# Patient Record
Sex: Female | Born: 1980 | Race: White | Hispanic: No | Marital: Married | Smoking: Former smoker
Health system: Southern US, Community
[De-identification: ages and names within clinical notes are randomized; demographics above are authoritative.]

## PROBLEM LIST (undated history)

## (undated) DIAGNOSIS — F419 Anxiety disorder, unspecified: Secondary | ICD-10-CM

## (undated) DIAGNOSIS — E039 Hypothyroidism, unspecified: Secondary | ICD-10-CM

## (undated) DIAGNOSIS — K219 Gastro-esophageal reflux disease without esophagitis: Secondary | ICD-10-CM

## (undated) DIAGNOSIS — E785 Hyperlipidemia, unspecified: Secondary | ICD-10-CM

## (undated) DIAGNOSIS — K589 Irritable bowel syndrome without diarrhea: Secondary | ICD-10-CM

## (undated) DIAGNOSIS — F32A Depression, unspecified: Secondary | ICD-10-CM

## (undated) HISTORY — PX: MOUTH SURGERY: SHX715

---

## 2004-03-28 ENCOUNTER — Emergency Department (HOSPITAL_COMMUNITY): Admission: EM | Admit: 2004-03-28 | Discharge: 2004-03-28 | Payer: Self-pay | Admitting: Emergency Medicine

## 2006-04-09 ENCOUNTER — Other Ambulatory Visit: Admission: RE | Admit: 2006-04-09 | Discharge: 2006-04-09 | Payer: Self-pay | Admitting: Obstetrics and Gynecology

## 2007-04-22 ENCOUNTER — Other Ambulatory Visit: Admission: RE | Admit: 2007-04-22 | Discharge: 2007-04-22 | Payer: Self-pay | Admitting: Obstetrics and Gynecology

## 2007-09-02 ENCOUNTER — Encounter: Admission: RE | Admit: 2007-09-02 | Discharge: 2007-09-02 | Payer: Self-pay | Admitting: Family Medicine

## 2008-01-27 ENCOUNTER — Ambulatory Visit (HOSPITAL_COMMUNITY): Admission: RE | Admit: 2008-01-27 | Discharge: 2008-01-27 | Payer: Self-pay | Admitting: Obstetrics and Gynecology

## 2008-07-05 ENCOUNTER — Encounter: Admission: RE | Admit: 2008-07-05 | Discharge: 2008-07-05 | Payer: Self-pay | Admitting: Obstetrics and Gynecology

## 2008-08-08 ENCOUNTER — Inpatient Hospital Stay (HOSPITAL_COMMUNITY): Admission: AD | Admit: 2008-08-08 | Discharge: 2008-08-11 | Payer: Self-pay | Admitting: Obstetrics and Gynecology

## 2008-08-13 ENCOUNTER — Encounter: Admission: RE | Admit: 2008-08-13 | Discharge: 2008-09-11 | Payer: Self-pay | Admitting: Obstetrics and Gynecology

## 2008-08-17 ENCOUNTER — Ambulatory Visit: Admission: RE | Admit: 2008-08-17 | Discharge: 2008-08-17 | Payer: Self-pay | Admitting: Obstetrics and Gynecology

## 2008-09-12 ENCOUNTER — Encounter: Admission: RE | Admit: 2008-09-12 | Discharge: 2008-09-15 | Payer: Self-pay | Admitting: Obstetrics and Gynecology

## 2008-11-16 ENCOUNTER — Other Ambulatory Visit: Admission: RE | Admit: 2008-11-16 | Discharge: 2008-11-16 | Payer: Self-pay | Admitting: Obstetrics and Gynecology

## 2009-12-25 ENCOUNTER — Other Ambulatory Visit: Admission: RE | Admit: 2009-12-25 | Discharge: 2009-12-25 | Payer: Self-pay | Admitting: Obstetrics and Gynecology

## 2010-12-31 ENCOUNTER — Other Ambulatory Visit (HOSPITAL_COMMUNITY)
Admission: RE | Admit: 2010-12-31 | Discharge: 2010-12-31 | Disposition: A | Discharge status: Home / Self Care | Payer: BC Managed Care – PPO | Source: Ambulatory Visit | Attending: Obstetrics and Gynecology | Admitting: Obstetrics and Gynecology

## 2010-12-31 ENCOUNTER — Other Ambulatory Visit: Payer: Self-pay | Admitting: Obstetrics and Gynecology

## 2010-12-31 DIAGNOSIS — Z01419 Encounter for gynecological examination (general) (routine) without abnormal findings: Secondary | ICD-10-CM | POA: Insufficient documentation

## 2011-07-15 LAB — CBC
HCT: 27.3 — ABNORMAL LOW
HCT: 38.3
Hemoglobin: 12.9
Hemoglobin: 9.4 — ABNORMAL LOW
MCHC: 33.8
MCHC: 34.3
Platelets: 146 — ABNORMAL LOW
RBC: 2.9 — ABNORMAL LOW
RBC: 4.16
RDW: 12.8
WBC: 13.5 — ABNORMAL HIGH

## 2012-01-15 ENCOUNTER — Other Ambulatory Visit: Payer: Self-pay | Admitting: Nurse Practitioner

## 2012-01-15 ENCOUNTER — Other Ambulatory Visit (HOSPITAL_COMMUNITY)
Admission: RE | Admit: 2012-01-15 | Discharge: 2012-01-15 | Disposition: A | Discharge status: Home / Self Care | Payer: BC Managed Care – PPO | Source: Ambulatory Visit | Attending: Obstetrics and Gynecology | Admitting: Obstetrics and Gynecology

## 2012-01-15 DIAGNOSIS — Z01419 Encounter for gynecological examination (general) (routine) without abnormal findings: Secondary | ICD-10-CM | POA: Insufficient documentation

## 2012-01-15 DIAGNOSIS — Z1159 Encounter for screening for other viral diseases: Secondary | ICD-10-CM | POA: Insufficient documentation

## 2013-02-23 ENCOUNTER — Other Ambulatory Visit: Payer: Self-pay | Admitting: Obstetrics and Gynecology

## 2013-02-23 ENCOUNTER — Other Ambulatory Visit (HOSPITAL_COMMUNITY)
Admission: RE | Admit: 2013-02-23 | Discharge: 2013-02-23 | Disposition: A | Discharge status: Home / Self Care | Payer: BC Managed Care – PPO | Source: Ambulatory Visit | Attending: Obstetrics and Gynecology | Admitting: Obstetrics and Gynecology

## 2013-02-23 DIAGNOSIS — Z01419 Encounter for gynecological examination (general) (routine) without abnormal findings: Secondary | ICD-10-CM | POA: Insufficient documentation

## 2014-02-27 ENCOUNTER — Other Ambulatory Visit (HOSPITAL_COMMUNITY)
Admission: RE | Admit: 2014-02-27 | Discharge: 2014-02-27 | Disposition: A | Discharge status: Home / Self Care | Payer: BC Managed Care – PPO | Source: Ambulatory Visit | Attending: Obstetrics and Gynecology | Admitting: Obstetrics and Gynecology

## 2014-02-27 ENCOUNTER — Other Ambulatory Visit: Payer: Self-pay | Admitting: Obstetrics and Gynecology

## 2014-02-27 DIAGNOSIS — Z01419 Encounter for gynecological examination (general) (routine) without abnormal findings: Secondary | ICD-10-CM | POA: Insufficient documentation

## 2014-04-24 ENCOUNTER — Other Ambulatory Visit: Payer: Self-pay | Admitting: Family Medicine

## 2014-04-24 ENCOUNTER — Encounter (INDEPENDENT_AMBULATORY_CARE_PROVIDER_SITE_OTHER): Payer: Self-pay

## 2014-04-24 ENCOUNTER — Ambulatory Visit
Admission: RE | Admit: 2014-04-24 | Discharge: 2014-04-24 | Disposition: A | Discharge status: Home / Self Care | Payer: BC Managed Care – PPO | Source: Ambulatory Visit | Attending: Family Medicine | Admitting: Family Medicine

## 2014-04-24 DIAGNOSIS — M79604 Pain in right leg: Secondary | ICD-10-CM

## 2015-03-19 ENCOUNTER — Other Ambulatory Visit: Payer: Self-pay | Admitting: Obstetrics and Gynecology

## 2015-03-19 ENCOUNTER — Other Ambulatory Visit (HOSPITAL_COMMUNITY)
Admission: RE | Admit: 2015-03-19 | Discharge: 2015-03-19 | Disposition: A | Discharge status: Home / Self Care | Payer: BC Managed Care – PPO | Source: Ambulatory Visit | Attending: Obstetrics and Gynecology | Admitting: Obstetrics and Gynecology

## 2015-03-19 DIAGNOSIS — Z01419 Encounter for gynecological examination (general) (routine) without abnormal findings: Secondary | ICD-10-CM | POA: Diagnosis not present

## 2015-03-19 DIAGNOSIS — Z1151 Encounter for screening for human papillomavirus (HPV): Secondary | ICD-10-CM | POA: Insufficient documentation

## 2015-03-20 LAB — CYTOLOGY - PAP

## 2016-02-15 ENCOUNTER — Encounter (HOSPITAL_COMMUNITY): Payer: Self-pay | Admitting: Emergency Medicine

## 2016-02-15 ENCOUNTER — Emergency Department (HOSPITAL_COMMUNITY)
Admission: EM | Admit: 2016-02-15 | Discharge: 2016-02-16 | Disposition: A | Discharge status: Home / Self Care | Payer: BC Managed Care – PPO | Attending: Emergency Medicine | Admitting: Emergency Medicine

## 2016-02-15 DIAGNOSIS — L500 Allergic urticaria: Secondary | ICD-10-CM | POA: Diagnosis not present

## 2016-02-15 DIAGNOSIS — Y998 Other external cause status: Secondary | ICD-10-CM | POA: Diagnosis not present

## 2016-02-15 DIAGNOSIS — R0602 Shortness of breath: Secondary | ICD-10-CM | POA: Insufficient documentation

## 2016-02-15 DIAGNOSIS — R220 Localized swelling, mass and lump, head: Secondary | ICD-10-CM | POA: Diagnosis not present

## 2016-02-15 DIAGNOSIS — Z87891 Personal history of nicotine dependence: Secondary | ICD-10-CM | POA: Insufficient documentation

## 2016-02-15 DIAGNOSIS — T7840XA Allergy, unspecified, initial encounter: Secondary | ICD-10-CM | POA: Diagnosis present

## 2016-02-15 DIAGNOSIS — R000 Tachycardia, unspecified: Secondary | ICD-10-CM | POA: Diagnosis not present

## 2016-02-15 DIAGNOSIS — Y9289 Other specified places as the place of occurrence of the external cause: Secondary | ICD-10-CM | POA: Diagnosis not present

## 2016-02-15 DIAGNOSIS — X58XXXA Exposure to other specified factors, initial encounter: Secondary | ICD-10-CM | POA: Diagnosis not present

## 2016-02-15 DIAGNOSIS — Y9389 Activity, other specified: Secondary | ICD-10-CM | POA: Insufficient documentation

## 2016-02-15 MED ORDER — FAMOTIDINE IN NACL 20-0.9 MG/50ML-% IV SOLN
20.0000 mg | Freq: Once | INTRAVENOUS | Status: AC
  Administered 2016-02-15: 20 mg via INTRAVENOUS
  Filled 2016-02-15: qty 50

## 2016-02-15 MED ORDER — PREDNISONE 50 MG PO TABS: 50.0000 mg | ORAL_TABLET | Freq: Every day | ORAL | Status: DC

## 2016-02-15 MED ORDER — EPINEPHRINE 0.3 MG/0.3ML IJ SOAJ: 0.3000 mg | Freq: Once | INTRAMUSCULAR | Status: AC

## 2016-02-15 NOTE — ED Provider Notes (Signed)
CSN: [id number]     Arrival date & time 02/15/16  2218 History   First MD Initiated Contact with Patient 02/15/16 2248     Chief Complaint  Patient presents with  . Allergic Reaction     (Consider location/radiation/quality/duration/timing/severity/associated sxs/prior Treatment) HPI Comments: Patient here complaining of allergic reaction from an unknown allergen just prior to arrival. She has multiple known allergies but states nothing has changed recently. She developed whole body erythema with some pruritus. She became short of breath and had swelling of her lip as well as her ear with some emesis. Shows mixed prior to her wheezing. EMS was called and patient given Benadryl 50 mg as well as albuterol 5 mg, 0.3 mg of epinephrine, Solu-Medrol 125 mg followed by 4 Zofran. She felt this better at this time. Her skin erythema has greatly improved.  Patient is a 35 y.o. female presenting with allergic reaction. The history is provided by the patient.  Allergic Reaction   History reviewed. No pertinent past medical history. History reviewed. No pertinent past surgical history. History reviewed. No pertinent family history. Social History  Substance Use Topics  . Smoking status: Former Smoker    Quit date: 02/15/1999  . Smokeless tobacco: Never Used  . Alcohol Use: Yes     Comment: occassionally   OB History    No data available     Review of Systems  All other systems reviewed and are negative.     Allergies  Ginger; Sesame oil; and Shellfish allergy  Home Medications   Prior to Admission medications   Not on File   BP 112/73 mmHg  Pulse 102  Temp(Src) 97.5 F (36.4 C) (Oral)  Resp 18  SpO2 97%  LMP  (Within Weeks) Physical Exam  Constitutional: She is oriented to person, place, and time. She appears well-developed and well-nourished.  Non-toxic appearance. No distress.  HENT:  Head: Normocephalic and atraumatic.  Eyes: Conjunctivae, EOM and lids are normal. Pupils  are equal, round, and reactive to light.  Neck: Normal range of motion. Neck supple. No tracheal deviation present. No thyroid mass present.  Cardiovascular: Regular rhythm and normal heart sounds.  Tachycardia present.  Exam reveals no gallop.   No murmur heard. Pulmonary/Chest: Effort normal and breath sounds normal. No stridor. No respiratory distress. She has no decreased breath sounds. She has no wheezes. She has no rhonchi. She has no rales.  Abdominal: Soft. Normal appearance and bowel sounds are normal. She exhibits no distension. There is no tenderness. There is no rebound and no CVA tenderness.  Musculoskeletal: Normal range of motion. She exhibits no edema or tenderness.  Neurological: She is alert and oriented to person, place, and time. She has normal strength. No cranial nerve deficit or sensory deficit. GCS eye subscore is 4. GCS verbal subscore is 5. GCS motor subscore is 6.  Skin: Skin is warm and dry. Rash noted. No abrasion noted. Rash is urticarial.  Psychiatric: She has a normal mood and affect. Her speech is normal and behavior is normal.  Nursing note and vitals reviewed.   ED Course  Procedures (including critical care time) Labs Review Labs Reviewed - No data to display  Imaging Review No results found. I have personally reviewed and evaluated these images and lab results as part of my medical decision-making.   EKG Interpretation None      MDM   Final diagnoses:  None    She will be given Pepcid IV piggyback care. Should be monitored  here for 2 hours. Will be given prescription for epi pin and she will follow-up with her allergist    Lorre NickAnthony Dejanique Ruehl, MD 02/15/16 2258

## 2016-02-15 NOTE — ED Notes (Signed)
Bed: WA04 Expected date:  Expected time:  Means of arrival:  Comments: Allergic reaction

## 2016-02-15 NOTE — ED Notes (Signed)
[facility] presents with a 35 yo female from home with an apparent allergic reaction.  Pt has allergies to ginger, shellfish and sesame but unknown to other allergens but patient got same reaction to something just before 9 pm this evening.  Pt became flushed, SOB, tingling in extremities, bilateral ear and lip swelling with vomiting.  Pt also had expiratory wheeze.  [facility] gave 50 mg IV Benadryl, 5 mg Albuterol, 0.3 epi IM, 125 mg Solumedrol IV, 4 mg IV Zofran.  Pt sinus tach on monitor/12 lead

## 2016-02-15 NOTE — Discharge Instructions (Signed)

## 2016-02-16 NOTE — ED Provider Notes (Signed)
By signing my name below, I, Emmanuella Mensah, attest that this documentation has been prepared under the direction and in the presence of Linwood DibblesJon Asianae Minkler, MD. Electronically Signed: Angelene GiovanniEmmanuella Mensah, ED Scribe. 02/16/2016. 12:04 AM.   12:04 AM Pt states that she feels well, no complaints. Allergic symptoms resolved. Will continue to monitor.   12:57 AM No recurrent symptoms. Pt feels fine. Will discharge with outpatient follow up.   I personally performed the services described in this documentation, which was scribed in my presence.  The recorded information has been reviewed and is accurate.   Linwood DibblesJon Tahlia Deamer, MD 02/16/16 0100

## 2016-05-08 ENCOUNTER — Other Ambulatory Visit: Payer: Self-pay | Admitting: Obstetrics and Gynecology

## 2016-05-08 ENCOUNTER — Other Ambulatory Visit (HOSPITAL_COMMUNITY)
Admission: RE | Admit: 2016-05-08 | Discharge: 2016-05-08 | Disposition: A | Discharge status: Home / Self Care | Payer: BC Managed Care – PPO | Source: Ambulatory Visit | Attending: Obstetrics and Gynecology | Admitting: Obstetrics and Gynecology

## 2016-05-08 DIAGNOSIS — Z01419 Encounter for gynecological examination (general) (routine) without abnormal findings: Secondary | ICD-10-CM | POA: Insufficient documentation

## 2016-05-09 LAB — CYTOLOGY - PAP

## 2018-05-12 ENCOUNTER — Other Ambulatory Visit (HOSPITAL_COMMUNITY)
Admission: RE | Admit: 2018-05-12 | Discharge: 2018-05-12 | Disposition: A | Discharge status: Home / Self Care | Payer: BC Managed Care – PPO | Source: Ambulatory Visit | Attending: Obstetrics and Gynecology | Admitting: Obstetrics and Gynecology

## 2018-05-12 ENCOUNTER — Other Ambulatory Visit: Payer: Self-pay | Admitting: Obstetrics and Gynecology

## 2018-05-12 DIAGNOSIS — Z01411 Encounter for gynecological examination (general) (routine) with abnormal findings: Secondary | ICD-10-CM | POA: Diagnosis present

## 2018-05-13 LAB — CYTOLOGY - PAP
DIAGNOSIS: NEGATIVE
HPV: NOT DETECTED

## 2019-09-05 ENCOUNTER — Other Ambulatory Visit: Payer: Self-pay

## 2019-09-05 DIAGNOSIS — Z20822 Contact with and (suspected) exposure to covid-19: Secondary | ICD-10-CM

## 2019-09-07 LAB — NOVEL CORONAVIRUS, NAA: SARS-CoV-2, NAA: NOT DETECTED

## 2020-06-19 ENCOUNTER — Other Ambulatory Visit: Payer: Self-pay | Admitting: Family Medicine

## 2020-06-19 DIAGNOSIS — Z1231 Encounter for screening mammogram for malignant neoplasm of breast: Secondary | ICD-10-CM

## 2020-06-22 ENCOUNTER — Other Ambulatory Visit: Payer: Self-pay

## 2020-06-22 ENCOUNTER — Ambulatory Visit
Admission: RE | Admit: 2020-06-22 | Discharge: 2020-06-22 | Disposition: A | Discharge status: Home / Self Care | Payer: BC Managed Care – PPO | Source: Ambulatory Visit | Attending: Family Medicine | Admitting: Family Medicine

## 2020-06-22 DIAGNOSIS — Z1231 Encounter for screening mammogram for malignant neoplasm of breast: Secondary | ICD-10-CM

## 2021-03-24 ENCOUNTER — Other Ambulatory Visit: Payer: Self-pay

## 2021-03-24 ENCOUNTER — Emergency Department (HOSPITAL_COMMUNITY): Payer: BC Managed Care – PPO

## 2021-03-24 ENCOUNTER — Emergency Department (HOSPITAL_COMMUNITY)
Admission: EM | Admit: 2021-03-24 | Discharge: 2021-03-25 | Disposition: A | Discharge status: Home / Self Care | Payer: BC Managed Care – PPO | Attending: Emergency Medicine | Admitting: Emergency Medicine

## 2021-03-24 DIAGNOSIS — W109XXA Fall (on) (from) unspecified stairs and steps, initial encounter: Secondary | ICD-10-CM | POA: Diagnosis not present

## 2021-03-24 DIAGNOSIS — Z87891 Personal history of nicotine dependence: Secondary | ICD-10-CM | POA: Diagnosis not present

## 2021-03-24 DIAGNOSIS — R000 Tachycardia, unspecified: Secondary | ICD-10-CM | POA: Insufficient documentation

## 2021-03-24 DIAGNOSIS — R52 Pain, unspecified: Secondary | ICD-10-CM

## 2021-03-24 DIAGNOSIS — S82851A Displaced trimalleolar fracture of right lower leg, initial encounter for closed fracture: Secondary | ICD-10-CM | POA: Diagnosis not present

## 2021-03-24 DIAGNOSIS — Y92009 Unspecified place in unspecified non-institutional (private) residence as the place of occurrence of the external cause: Secondary | ICD-10-CM | POA: Insufficient documentation

## 2021-03-24 DIAGNOSIS — S99911A Unspecified injury of right ankle, initial encounter: Secondary | ICD-10-CM | POA: Diagnosis present

## 2021-03-24 MED ORDER — ETOMIDATE 2 MG/ML IV SOLN
INTRAVENOUS | Status: AC | PRN
  Administered 2021-03-24: 10 mg via INTRAVENOUS

## 2021-03-24 MED ORDER — HYDROMORPHONE HCL 1 MG/ML IJ SOLN
1.0000 mg | Freq: Once | INTRAMUSCULAR | Status: AC
  Administered 2021-03-24: 1 mg via INTRAVENOUS
  Filled 2021-03-24: qty 1

## 2021-03-24 MED ORDER — OXYCODONE-ACETAMINOPHEN 5-325 MG PO TABS: 1.0000 | ORAL_TABLET | Freq: Four times a day (QID) | ORAL | 0 refills | Status: DC | PRN

## 2021-03-24 MED ORDER — HYDROMORPHONE HCL 1 MG/ML IJ SOLN
1.0000 mg | Freq: Once | INTRAMUSCULAR | Status: AC
Start: 2021-03-24 — End: 2021-03-24
  Administered 2021-03-24: 1 mg via INTRAVENOUS
  Filled 2021-03-24: qty 1

## 2021-03-24 MED ORDER — LORAZEPAM 2 MG/ML IJ SOLN
0.5000 mg | Freq: Once | INTRAMUSCULAR | Status: AC
  Administered 2021-03-24: 0.5 mg via INTRAVENOUS
  Filled 2021-03-24: qty 1

## 2021-03-24 MED ORDER — IBUPROFEN 600 MG PO TABS: 600.0000 mg | ORAL_TABLET | Freq: Four times a day (QID) | ORAL | 0 refills | Status: AC | PRN

## 2021-03-24 MED ORDER — ETOMIDATE 2 MG/ML IV SOLN
10.0000 mg | Freq: Once | INTRAVENOUS | Status: AC
  Administered 2021-03-24: 10 mg via INTRAVENOUS
  Filled 2021-03-24: qty 10

## 2021-03-24 NOTE — ED Triage Notes (Signed)
PT BIB EMS due to falling down the stairs. Pt has complete deformity to right ankle. Pt denies hitting head. Pt received 10mg  of morphine.

## 2021-03-24 NOTE — Discharge Instructions (Addendum)
Non weight bearing to right ankle.  Keep ankle elevated.

## 2021-03-24 NOTE — ED Provider Notes (Signed)
[facility] EMERGENCY DEPARTMENT Provider Note   CSN: [id number] Arrival date & time: 03/24/21  2118     History Chief Complaint  Patient presents with   Ankle Pain    April Schultz is a 40 y.o. female.  Pt presents to the ED today with right ankle pain.  Pt fell down the stairs at home.  She denies hitting her head or loc.  EMS gave pt 10 mg of morphine en route for pain control, but it still hurts a lot.  No other injuries.      No past medical history on file.  There are no problems to display for this patient.   No past surgical history on file.   OB History   No obstetric history on file.     Family History  Problem Relation Age of Onset   Breast cancer Mother 45    Social History   Tobacco Use   Smoking status: Former    Pack years: 0.00    Types: Cigarettes    Quit date: 02/15/1999    Years since quitting: 22.1   Smokeless tobacco: Never  Substance Use Topics   Alcohol use: Yes    Comment: occassionally   Drug use: No    Home Medications Prior to Admission medications   Medication Sig Start Date End Date Taking? Authorizing Provider  ibuprofen (ADVIL) 600 MG tablet Take 1 tablet (600 mg total) by mouth every 6 (six) hours as needed. 03/24/21  Yes Jacalyn Lefevre, MD  oxyCODONE-acetaminophen (PERCOCET/ROXICET) 5-325 MG tablet Take 1 tablet by mouth every 6 (six) hours as needed for severe pain. 03/24/21  Yes Jacalyn Lefevre, MD  ALPRAZolam Prudy Feeler) 0.25 MG tablet Take 0.25-0.5 mg by mouth 3 (three) times daily as needed. For anxiety. 11/23/15   [provider]  buPROPion (WELLBUTRIN XL) 150 MG 24 hr tablet Take 150 mg by mouth every morning. 12/18/15   [provider]  cetirizine (ZYRTEC) 10 MG tablet Take 10 mg by mouth daily.    [provider]  clonazePAM (KLONOPIN) 0.5 MG tablet Take 0.5-1 mg by mouth at bedtime as needed. For insomnia. 11/23/15   [provider]  EPINEPHrine 0.3 mg/0.3 mL IJ  SOAJ injection Inject 0.3 mLs (0.3 mg total) into the muscle once. 02/15/16   Lorre Nick, MD  norethindrone-ethinyl estradiol (MICROGESTIN,JUNEL,LOESTRIN) 1-20 MG-MCG tablet Take 1 tablet by mouth daily. 11/25/15   [provider]  predniSONE (DELTASONE) 50 MG tablet Take 1 tablet (50 mg total) by mouth daily. 02/15/16   Lorre Nick, MD  sertraline (ZOLOFT) 100 MG tablet Take 50 mg by mouth daily. 12/08/15   [provider]    Allergies    Ginger, Sesame oil, and Shellfish allergy  Review of Systems   Review of Systems  Musculoskeletal:        Right ankle pain  All other systems reviewed and are negative.  Physical Exam Updated Vital Signs BP 116/78   Pulse 98   Temp 98.4 F (36.9 C) (Oral)   Resp 12   SpO2 96%   Physical Exam Vitals and nursing note reviewed.  Constitutional:      Appearance: Normal appearance.  HENT:     Head: Normocephalic and atraumatic.     Right Ear: External ear normal.     Left Ear: External ear normal.     Nose: Nose normal.     Mouth/Throat:     Mouth: Mucous membranes are moist.  Pharynx: Oropharynx is clear.  Eyes:     Extraocular Movements: Extraocular movements intact.     Conjunctiva/sclera: Conjunctivae normal.     Pupils: Pupils are equal, round, and reactive to light.  Cardiovascular:     Rate and Rhythm: Regular rhythm. Tachycardia present.     Pulses: Normal pulses.     Heart sounds: Normal heart sounds.  Pulmonary:     Effort: Pulmonary effort is normal.     Breath sounds: Normal breath sounds.  Abdominal:     General: Abdomen is flat. Bowel sounds are normal.     Palpations: Abdomen is soft.  Musculoskeletal:     Cervical back: Normal range of motion and neck supple.     Comments: Deformity and pain to right ankle  Skin:    General: Skin is warm.     Capillary Refill: Capillary refill takes less than 2 seconds.  Neurological:     General: No focal deficit present.     Mental Status: She is alert and  oriented to person, place, and time.    ED Results / Procedures / Treatments   Labs (all labs ordered are listed, but only abnormal results are displayed) Labs Reviewed - No data to display  EKG None  Radiology DG Ankle Right Port  Result Date: 03/24/2021 CLINICAL DATA:  Postreduction EXAM: PORTABLE RIGHT ANKLE - 2 VIEW COMPARISON:  04/23/2021 FINDINGS: Interval reduction of the previously seen dislocation of the right ankle. Tibiotalar joint appears restored. Continued displacement across the distal fibular fracture. IMPRESSION: Improved alignment with reduction of the dislocated right ankle. Continued displacement across the distal fibular fracture. Electronically Signed   By: Charlett Nose M.D.   On: 03/24/2021 23:02   DG Ankle Right Port  Result Date: 03/24/2021 CLINICAL DATA:  Right ankle deformity after fall down stairs. EXAM: PORTABLE RIGHT ANKLE - 2 VIEW COMPARISON:  None. FINDINGS: Trimalleolar fracture dislocation of the right ankle. There is complete posterior displacement of the talus with respect to the tibia. Comminuted coronal fractures of the posterior malleolus with posterior displacement of the major fracture fragments. Oblique fracture of the distal fibula with posterior displacement of the distal fracture fragments. The medial malleolus is mostly obscured but there is evidence of slight fracture deformity at the visualized medial malleolus and also small fracture fragments are demonstrated at the anterior malleolus. The talar dome appears slightly irregular, likely due to rotation but a talar fracture is not excluded. IMPRESSION: Trimalleolar fracture dislocation of the right ankle. Electronically Signed   By: Burman Nieves M.D.   On: 03/24/2021 22:03    Procedures Reduction of fracture  Date/Time: 03/24/2021 11:22 PM Performed by: Jacalyn Lefevre, MD Authorized by: Jacalyn Lefevre, MD  Consent: Written consent obtained. Consent given by: patient Time out: Immediately  prior to procedure a "time out" was called to verify the correct patient, procedure, equipment, support staff and site/side marked as required. Preparation: Patient was prepped and draped in the usual sterile fashion. Local anesthesia used: no  Anesthesia: Local anesthesia used: no  Sedation: Patient sedated: yes Sedation type: moderate (conscious) sedation Sedatives: etomidate Analgesia: hydromorphone and morphine  Patient tolerance: patient tolerated the procedure well with no immediate complications   .Sedation  Date/Time: 03/24/2021 11:23 PM Performed by: Jacalyn Lefevre, MD Authorized by: Jacalyn Lefevre, MD   Consent:    Consent obtained:  Written   Consent given by:  Patient   Risks discussed:  Allergic reaction Universal protocol:    Immediately prior to procedure, a  time out was called: yes   Pre-sedation assessment:    Time since last food or drink:  4   ASA classification: class 1 - normal, healthy patient     Mallampati score:  I - soft palate, uvula, fauces, pillars visible   Neck mobility: normal     Pre-sedation assessments completed and reviewed: airway patency, cardiovascular function, hydration status, mental status, nausea/vomiting, pain level, respiratory function and temperature   Immediate pre-procedure details:    Reassessment: Patient reassessed immediately prior to procedure   Procedure details (see MAR for exact dosages):    Preoxygenation:  Room air   Sedation:  Etomidate   Intended level of sedation: deep   Analgesia:  Morphine and hydromorphone   Intra-procedure monitoring:  Blood pressure monitoring, cardiac monitor, continuous capnometry, continuous pulse oximetry, frequent LOC assessments and frequent vital sign checks   Intra-procedure events: hypoxia     Intra-procedure management:  Airway repositioning and supplemental oxygen   Total Provider sedation time (minutes):  30 Post-procedure details:    Post-sedation assessment completed:   03/24/2021 11:24 PM   Attendance: Constant attendance by certified staff until patient recovered     Recovery: Patient returned to pre-procedure baseline     Post-sedation assessments completed and reviewed: airway patency, cardiovascular function, hydration status, mental status, nausea/vomiting, pain level, respiratory function and temperature     Patient is stable for discharge or admission: yes   Comments:     Pt required 2 doses of etomidate (20 mg total) for the sedation as her initial 10 mg wore off prior to the splint getting placed.  Pt's O2 sat dropped briefly and she was bagged up to normal.  She is now awake and alert.   Medications Ordered in ED Medications  HYDROmorphone (DILAUDID) injection 1 mg (1 mg Intravenous Given 03/24/21 2123)  LORazepam (ATIVAN) injection 0.5 mg (0.5 mg Intravenous Given 03/24/21 2123)  HYDROmorphone (DILAUDID) injection 1 mg (1 mg Intravenous Given 03/24/21 2144)  etomidate (AMIDATE) injection 10 mg (10 mg Intravenous Given 03/24/21 2233)  etomidate (AMIDATE) injection (10 mg Intravenous Given 03/24/21 2235)    ED Course  I have reviewed the triage vital signs and the nursing notes.  Pertinent labs & imaging results that were available during my care of the patient were reviewed by me and considered in my medical decision making (see chart for details).    MDM Rules/Calculators/A&P                          Pt d/w Dr. Charlann Boxer ([place]) who did not feel she needed a CT scan prior to d/c.  She is to be non weight bearing, elevate, and f/u with Dr. Victorino Dike on Wed.  Return if worse.    Final Clinical Impression(s) / ED Diagnoses Final diagnoses:  Pain  Closed trimalleolar fracture of right ankle, initial encounter    Rx / DC Orders ED Discharge Orders          Ordered    oxyCODONE-acetaminophen (PERCOCET/ROXICET) 5-325 MG tablet  Every 6 hours PRN        03/24/21 2321    ibuprofen (ADVIL) 600 MG tablet  Every 6 hours PRN        03/24/21 2327              Jacalyn Lefevre, MD 03/24/21 2328

## 2021-03-24 NOTE — ED Provider Notes (Signed)
Reduction of dislocation  Date/Time: 03/24/2021 10:50 PM Performed by: Maxwell Caul, PA-C Authorized by: Maxwell Caul, PA-C  Consent: Verbal consent obtained. Risks and benefits: risks, benefits and alternatives were discussed Consent given by: patient Patient understanding: patient states understanding of the procedure being performed Patient consent: the patient's understanding of the procedure matches consent given Procedure consent: procedure consent matches procedure scheduled Relevant documents: relevant documents present and verified Test results: test results available and properly labeled Site marked: the operative site was marked Imaging studies: imaging studies available Required items: required blood products, implants, devices, and special equipment available Time out: Immediately prior to procedure a "time out" was called to verify the correct patient, procedure, equipment, support staff and site/side marked as required.  Sedation: Patient sedated: See separate attending note.  Patient tolerance: patient tolerated the procedure well with no immediate complications   Reduction as documented above.  Patient with good DP pulses noted to the right lower extremity after reduction.  Portions of this note were generated with Scientist, clinical (histocompatibility and immunogenetics). Dictation errors may occur despite best attempts at proofreading.     Maxwell Caul, PA-C 03/24/21 2251    Jacalyn Lefevre, MD 03/27/21 [phone]

## 2021-03-24 NOTE — Progress Notes (Signed)
Orthopedic Tech Progress Note Patient Details:  April Schultz 09-21-1981 [id number]  Ortho Devices Type of Ortho Device: Post (short leg) splint, Stirrup splint Ortho Device/Splint Location: rle. I applied splint post reduction. dr held for splint and molded splint. Ortho Device/Splint Interventions: Ordered, Application, Adjustment   Post Interventions Patient Tolerated: Well Instructions Provided: Care of device, Adjustment of device  [name] Post 03/24/2021, 10:54 PM

## 2021-03-24 NOTE — Progress Notes (Signed)
RT present for conscious sedation. Pt was bagged for a short time when meds were given but was arousable after about 30 seconds and RT used ambu bag as blow by for pt.  Pt was stimulated to stay awake until meds started to wear off.  RT will continue to monitor as needed,

## 2021-03-25 ENCOUNTER — Encounter (HOSPITAL_BASED_OUTPATIENT_CLINIC_OR_DEPARTMENT_OTHER): Payer: Self-pay | Admitting: Orthopedic Surgery

## 2021-03-25 ENCOUNTER — Other Ambulatory Visit (HOSPITAL_COMMUNITY): Payer: Self-pay | Admitting: Orthopedic Surgery

## 2021-03-25 MED ORDER — ONDANSETRON 4 MG PO TBDP
4.0000 mg | ORAL_TABLET | Freq: Once | ORAL | Status: AC
  Administered 2021-03-25: 4 mg via ORAL
  Filled 2021-03-25: qty 1

## 2021-03-25 MED ORDER — ONDANSETRON HCL 4 MG/2ML IJ SOLN
4.0000 mg | Freq: Once | INTRAMUSCULAR | Status: DC
  Filled 2021-03-25: qty 2

## 2021-03-25 NOTE — Progress Notes (Signed)
Orthopedic Tech Progress Note Patient Details:  April Schultz Jul 15, 1981 [id number]  Ortho Devices Type of Ortho Device: Crutches Ortho Device/Splint Location: rle. I applied splint post reduction. dr held for splint and molded splint. Ortho Device/Splint Interventions: Ordered, Application, Adjustment   Post Interventions Patient Tolerated: Well Instructions Provided: Care of device, Adjustment of device  [name] Post 03/25/2021, 12:51 AM

## 2021-03-27 ENCOUNTER — Ambulatory Visit
Admission: RE | Admit: 2021-03-27 | Discharge: 2021-03-27 | Disposition: A | Discharge status: Home / Self Care | Payer: BC Managed Care – PPO | Source: Ambulatory Visit | Attending: Orthopedic Surgery | Admitting: Orthopedic Surgery

## 2021-03-27 ENCOUNTER — Other Ambulatory Visit: Payer: Self-pay

## 2021-03-27 ENCOUNTER — Other Ambulatory Visit: Payer: Self-pay | Admitting: Orthopedic Surgery

## 2021-03-27 DIAGNOSIS — S82851A Displaced trimalleolar fracture of right lower leg, initial encounter for closed fracture: Secondary | ICD-10-CM

## 2021-03-28 ENCOUNTER — Ambulatory Visit (HOSPITAL_BASED_OUTPATIENT_CLINIC_OR_DEPARTMENT_OTHER)
Admission: RE | Admit: 2021-03-28 | Discharge: 2021-03-28 | Disposition: A | Discharge status: Home / Self Care | Payer: BC Managed Care – PPO | Attending: Orthopedic Surgery | Admitting: Orthopedic Surgery

## 2021-03-28 ENCOUNTER — Ambulatory Visit (HOSPITAL_BASED_OUTPATIENT_CLINIC_OR_DEPARTMENT_OTHER): Payer: BC Managed Care – PPO | Admitting: Anesthesiology

## 2021-03-28 ENCOUNTER — Encounter (HOSPITAL_BASED_OUTPATIENT_CLINIC_OR_DEPARTMENT_OTHER)
Admission: RE | Disposition: A | Discharge status: Home / Self Care | Payer: Self-pay | Source: Home / Self Care | Attending: Orthopedic Surgery

## 2021-03-28 ENCOUNTER — Other Ambulatory Visit: Payer: Self-pay

## 2021-03-28 ENCOUNTER — Encounter (HOSPITAL_BASED_OUTPATIENT_CLINIC_OR_DEPARTMENT_OTHER): Payer: Self-pay | Admitting: Orthopedic Surgery

## 2021-03-28 DIAGNOSIS — S93421A Sprain of deltoid ligament of right ankle, initial encounter: Secondary | ICD-10-CM | POA: Diagnosis not present

## 2021-03-28 DIAGNOSIS — Z79899 Other long term (current) drug therapy: Secondary | ICD-10-CM | POA: Insufficient documentation

## 2021-03-28 DIAGNOSIS — S82851A Displaced trimalleolar fracture of right lower leg, initial encounter for closed fracture: Secondary | ICD-10-CM | POA: Diagnosis present

## 2021-03-28 DIAGNOSIS — Z87891 Personal history of nicotine dependence: Secondary | ICD-10-CM | POA: Insufficient documentation

## 2021-03-28 DIAGNOSIS — Z888 Allergy status to other drugs, medicaments and biological substances status: Secondary | ICD-10-CM | POA: Insufficient documentation

## 2021-03-28 DIAGNOSIS — W109XXA Fall (on) (from) unspecified stairs and steps, initial encounter: Secondary | ICD-10-CM | POA: Diagnosis not present

## 2021-03-28 DIAGNOSIS — Z886 Allergy status to analgesic agent status: Secondary | ICD-10-CM | POA: Insufficient documentation

## 2021-03-28 HISTORY — DX: Irritable bowel syndrome, unspecified: K58.9

## 2021-03-28 HISTORY — DX: Anxiety disorder, unspecified: F41.9

## 2021-03-28 HISTORY — DX: Gastro-esophageal reflux disease without esophagitis: K21.9

## 2021-03-28 HISTORY — DX: Hyperlipidemia, unspecified: E78.5

## 2021-03-28 HISTORY — DX: Depression, unspecified: F32.A

## 2021-03-28 HISTORY — PX: ORIF ANKLE FRACTURE: SHX5408

## 2021-03-28 HISTORY — DX: Hypothyroidism, unspecified: E03.9

## 2021-03-28 SURGERY — OPEN REDUCTION INTERNAL FIXATION (ORIF) ANKLE FRACTURE
Anesthesia: General | Site: Ankle | Laterality: Right

## 2021-03-28 MED ORDER — 0.9 % SODIUM CHLORIDE (POUR BTL) OPTIME
TOPICAL | Status: DC | PRN
  Administered 2021-03-28: 120 mL

## 2021-03-28 MED ORDER — MIDAZOLAM HCL 2 MG/2ML IJ SOLN
2.0000 mg | Freq: Once | INTRAMUSCULAR | Status: AC
  Administered 2021-03-28: 2 mg via INTRAVENOUS

## 2021-03-28 MED ORDER — LIDOCAINE HCL (PF) 2 % IJ SOLN
INTRAMUSCULAR | Status: AC
  Filled 2021-03-28: qty 5

## 2021-03-28 MED ORDER — FENTANYL CITRATE (PF) 100 MCG/2ML IJ SOLN
INTRAMUSCULAR | Status: AC
  Filled 2021-03-28: qty 2

## 2021-03-28 MED ORDER — LACTATED RINGERS IV SOLN: INTRAVENOUS | Status: DC

## 2021-03-28 MED ORDER — LIDOCAINE HCL (CARDIAC) PF 100 MG/5ML IV SOSY
PREFILLED_SYRINGE | INTRAVENOUS | Status: DC | PRN
  Administered 2021-03-28: 60 mg via INTRATRACHEAL

## 2021-03-28 MED ORDER — OXYCODONE HCL 5 MG/5ML PO SOLN: 5.0000 mg | Freq: Once | ORAL | Status: DC | PRN

## 2021-03-28 MED ORDER — FENTANYL CITRATE (PF) 100 MCG/2ML IJ SOLN: 25.0000 ug | INTRAMUSCULAR | Status: DC | PRN

## 2021-03-28 MED ORDER — CEFAZOLIN SODIUM-DEXTROSE 2-4 GM/100ML-% IV SOLN
2.0000 g | INTRAVENOUS | Status: AC
  Administered 2021-03-28: 2 g via INTRAVENOUS

## 2021-03-28 MED ORDER — ONDANSETRON HCL 4 MG/2ML IJ SOLN
INTRAMUSCULAR | Status: AC
  Filled 2021-03-28: qty 2

## 2021-03-28 MED ORDER — VANCOMYCIN HCL 500 MG IV SOLR
INTRAVENOUS | Status: DC | PRN
  Administered 2021-03-28: 500 mg via TOPICAL

## 2021-03-28 MED ORDER — OXYCODONE HCL 5 MG PO TABS: 5.0000 mg | ORAL_TABLET | Freq: Once | ORAL | Status: DC | PRN

## 2021-03-28 MED ORDER — FENTANYL CITRATE (PF) 100 MCG/2ML IJ SOLN
100.0000 ug | Freq: Once | INTRAMUSCULAR | Status: AC
  Administered 2021-03-28: 100 ug via INTRAVENOUS

## 2021-03-28 MED ORDER — PROPOFOL 10 MG/ML IV BOLUS
INTRAVENOUS | Status: AC
  Filled 2021-03-28: qty 20

## 2021-03-28 MED ORDER — PROPOFOL 10 MG/ML IV BOLUS
INTRAVENOUS | Status: DC | PRN
  Administered 2021-03-28: 200 mg via INTRAVENOUS

## 2021-03-28 MED ORDER — CEFAZOLIN SODIUM-DEXTROSE 2-4 GM/100ML-% IV SOLN
INTRAVENOUS | Status: AC
  Filled 2021-03-28: qty 100

## 2021-03-28 MED ORDER — MIDAZOLAM HCL 2 MG/2ML IJ SOLN
INTRAMUSCULAR | Status: AC
  Filled 2021-03-28: qty 2

## 2021-03-28 MED ORDER — SODIUM CHLORIDE 0.9 % IV SOLN: INTRAVENOUS | Status: DC

## 2021-03-28 MED ORDER — DEXAMETHASONE SODIUM PHOSPHATE 10 MG/ML IJ SOLN
INTRAMUSCULAR | Status: AC
  Filled 2021-03-28: qty 1

## 2021-03-28 MED ORDER — ONDANSETRON HCL 4 MG/2ML IJ SOLN
INTRAMUSCULAR | Status: DC | PRN
  Administered 2021-03-28: 4 mg via INTRAVENOUS

## 2021-03-28 MED ORDER — FENTANYL CITRATE (PF) 100 MCG/2ML IJ SOLN
INTRAMUSCULAR | Status: DC | PRN
  Administered 2021-03-28: 50 ug via INTRAVENOUS
  Administered 2021-03-28: 25 ug via INTRAVENOUS

## 2021-03-28 MED ORDER — DEXAMETHASONE SODIUM PHOSPHATE 10 MG/ML IJ SOLN
INTRAMUSCULAR | Status: DC | PRN
  Administered 2021-03-28: 5 mg via INTRAVENOUS

## 2021-03-28 MED ORDER — OXYCODONE HCL 5 MG PO TABS: 5.0000 mg | ORAL_TABLET | Freq: Four times a day (QID) | ORAL | 0 refills | Status: AC | PRN

## 2021-03-28 MED ORDER — ONDANSETRON HCL 4 MG/2ML IJ SOLN
4.0000 mg | Freq: Once | INTRAMUSCULAR | Status: DC | PRN
Start: 2021-03-28 — End: 2021-03-28

## 2021-03-28 SURGICAL SUPPLY — 87 items
ANCH SUT 1 SHRT SM RGD INSRTR (Anchor) ×1 IMPLANT
ANCHOR SUT 1.45 SZ 1 SHORT (Anchor) ×2 IMPLANT
APL PRP STRL LF DISP 70% ISPRP (MISCELLANEOUS) ×1
BANDAGE ESMARK 6X9 LF (GAUZE/BANDAGES/DRESSINGS) IMPLANT
BIT DRILL 2.5X2.75 QC CALB (BIT) ×2 IMPLANT
BIT DRILL 2.9X70 QC CALB (BIT) ×2 IMPLANT
BIT DRILL 3.5X5.5 QC CALB (BIT) ×2 IMPLANT
BLADE SURG 15 STRL LF DISP TIS (BLADE) ×2 IMPLANT
BLADE SURG 15 STRL SS (BLADE) ×6
BNDG CMPR 9X4 STRL LF SNTH (GAUZE/BANDAGES/DRESSINGS)
BNDG CMPR 9X6 STRL LF SNTH (GAUZE/BANDAGES/DRESSINGS)
BNDG COHESIVE 4X5 TAN STRL (GAUZE/BANDAGES/DRESSINGS) ×3 IMPLANT
BNDG COHESIVE 6X5 TAN STRL LF (GAUZE/BANDAGES/DRESSINGS) ×3 IMPLANT
BNDG ESMARK 4X9 LF (GAUZE/BANDAGES/DRESSINGS) IMPLANT
BNDG ESMARK 6X9 LF (GAUZE/BANDAGES/DRESSINGS)
CANISTER SUCT 1200ML W/VALVE (MISCELLANEOUS) ×3 IMPLANT
CHLORAPREP W/TINT 26 (MISCELLANEOUS) ×3 IMPLANT
COVER BACK TABLE 60X90IN (DRAPES) ×3 IMPLANT
COVER WAND RF STERILE (DRAPES) IMPLANT
CUFF TOURN SGL QUICK 34 (TOURNIQUET CUFF) ×3
CUFF TRNQT CYL 34X4.125X (TOURNIQUET CUFF) IMPLANT
DECANTER SPIKE VIAL GLASS SM (MISCELLANEOUS) IMPLANT
DRAPE EXTREMITY T 121X128X90 (DISPOSABLE) ×3 IMPLANT
DRAPE OEC MINIVIEW 54X84 (DRAPES) ×3 IMPLANT
DRAPE U-SHAPE 47X51 STRL (DRAPES) ×3 IMPLANT
DRSG MEPITEL 4X7.2 (GAUZE/BANDAGES/DRESSINGS) ×3 IMPLANT
DRSG PAD ABDOMINAL 8X10 ST (GAUZE/BANDAGES/DRESSINGS) ×6 IMPLANT
ELECT REM PT RETURN 9FT ADLT (ELECTROSURGICAL) ×3
ELECTRODE REM PT RTRN 9FT ADLT (ELECTROSURGICAL) ×1 IMPLANT
GAUZE SPONGE 4X4 12PLY STRL (GAUZE/BANDAGES/DRESSINGS) ×3 IMPLANT
GLOVE SRG 8 PF TXTR STRL LF DI (GLOVE) ×2 IMPLANT
GLOVE SURG ENC MOIS LTX SZ8 (GLOVE) ×3 IMPLANT
GLOVE SURG LTX SZ8 (GLOVE) ×3 IMPLANT
GLOVE SURG POLYISO LF SZ7 (GLOVE) ×2 IMPLANT
GLOVE SURG UNDER POLY LF SZ7 (GLOVE) ×4 IMPLANT
GLOVE SURG UNDER POLY LF SZ8 (GLOVE) ×6
GOWN STRL REUS W/ TWL LRG LVL3 (GOWN DISPOSABLE) ×1 IMPLANT
GOWN STRL REUS W/ TWL XL LVL3 (GOWN DISPOSABLE) ×2 IMPLANT
GOWN STRL REUS W/TWL LRG LVL3 (GOWN DISPOSABLE) ×3
GOWN STRL REUS W/TWL XL LVL3 (GOWN DISPOSABLE) ×6
K-WIRE ACE 1.6X6 (WIRE) ×9
KWIRE ACE 1.6X6 (WIRE) IMPLANT
NEEDLE HYPO 22GX1.5 SAFETY (NEEDLE) IMPLANT
NS IRRIG 1000ML POUR BTL (IV SOLUTION) ×3 IMPLANT
PACK BASIN DAY SURGERY FS (CUSTOM PROCEDURE TRAY) ×3 IMPLANT
PAD CAST 4YDX4 CTTN HI CHSV (CAST SUPPLIES) ×1 IMPLANT
PADDING CAST ABS 4INX4YD NS (CAST SUPPLIES)
PADDING CAST ABS COTTON 4X4 ST (CAST SUPPLIES) IMPLANT
PADDING CAST COTTON 4X4 STRL (CAST SUPPLIES) ×3
PADDING CAST COTTON 6X4 STRL (CAST SUPPLIES) ×3 IMPLANT
PENCIL SMOKE EVACUATOR (MISCELLANEOUS) ×3 IMPLANT
PLATE ACE 100DEG 7HOLE (Plate) ×2 IMPLANT
SANITIZER HAND PURELL 535ML FO (MISCELLANEOUS) ×3 IMPLANT
SCREW ACE CAN 4.0 24M (Screw) ×2 IMPLANT
SCREW ACE CAN 4.0 38M (Screw) ×2 IMPLANT
SCREW ACE CAN 4.0 40M (Screw) ×2 IMPLANT
SCREW CORTICAL 3.5MM  16MM (Screw) ×6 IMPLANT
SCREW CORTICAL 3.5MM  20MM (Screw) ×3 IMPLANT
SCREW CORTICAL 3.5MM 14MM (Screw) ×6 IMPLANT
SCREW CORTICAL 3.5MM 16MM (Screw) IMPLANT
SCREW CORTICAL 3.5MM 18MM (Screw) ×2 IMPLANT
SCREW CORTICAL 3.5MM 20MM (Screw) IMPLANT
SCREW CORTICAL 3.5MM 24MM (Screw) ×2 IMPLANT
SHEET MEDIUM DRAPE 40X70 STRL (DRAPES) ×3 IMPLANT
SLEEVE SCD COMPRESS KNEE MED (STOCKING) ×3 IMPLANT
SPLINT FAST PLASTER 5X30 (CAST SUPPLIES) ×40
SPLINT PLASTER CAST FAST 5X30 (CAST SUPPLIES) ×20 IMPLANT
SPONGE LAP 18X18 RF (DISPOSABLE) ×3 IMPLANT
STOCKINETTE 6  STRL (DRAPES) ×3
STOCKINETTE 6 STRL (DRAPES) ×1 IMPLANT
SUCTION FRAZIER HANDLE 10FR (MISCELLANEOUS) ×3
SUCTION TUBE FRAZIER 10FR DISP (MISCELLANEOUS) ×1 IMPLANT
SUT ETHILON 3 0 PS 1 (SUTURE) ×3 IMPLANT
SUT FIBERWIRE #2 38 T-5 BLUE (SUTURE)
SUT MNCRL AB 3-0 PS2 18 (SUTURE) ×2 IMPLANT
SUT VIC AB 0 SH 27 (SUTURE) ×2 IMPLANT
SUT VIC AB 2-0 SH 27 (SUTURE) ×3
SUT VIC AB 2-0 SH 27XBRD (SUTURE) ×1 IMPLANT
SUTURE FIBERWR #2 38 T-5 BLUE (SUTURE) IMPLANT
SYR BULB EAR ULCER 3OZ GRN STR (SYRINGE) ×3 IMPLANT
SYR CONTROL 10ML LL (SYRINGE) IMPLANT
TOWEL GREEN STERILE FF (TOWEL DISPOSABLE) ×6 IMPLANT
TUBE CONNECTING 20'X1/4 (TUBING) ×1
TUBE CONNECTING 20X1/4 (TUBING) ×2 IMPLANT
UNDERPAD 30X36 HEAVY ABSORB (UNDERPADS AND DIAPERS) ×3 IMPLANT
WASHER FLAT ACE (Orthopedic Implant) ×3 IMPLANT
WASHER PLAIN FLAT ACE NS 3PK (Orthopedic Implant) IMPLANT

## 2021-03-28 NOTE — Op Note (Signed)
03/28/2021  2:42 PM  PATIENT:  April Schultz  40 y.o. female  PRE-OPERATIVE DIAGNOSIS:  right ankle trimalleolar fracture  POST-OPERATIVE DIAGNOSIS:  1.  right ankle trimalleolar fracture      2.  Right ankle deltoid ligament sprain Procedure(s): 1.  Open treatment of right ankle trimalleolar fracture with internal fixation including fixation of the posterior lip 2.  Stress examination of the right ankle under fluoroscopy 3.  Repair of the right ankle deltoid ligament 4.  AP, mortise and lateral radiographs of the right ankle  SURGEON:  Toni Arthurs, MD  ASSISTANT: None  ANESTHESIA:   General, regional  EBL:  minimal   TOURNIQUET:   Total Tourniquet Time Documented: Thigh (Right) - 64 minutes Total: Thigh (Right) - 64 minutes  COMPLICATIONS:  None apparent  DISPOSITION:  Extubated, awake and stable to recovery.  INDICATION FOR PROCEDURE: The patient is a 40 year old female without significant past medical history.  She injured her ankle about a week ago when she slipped off a rock steroid.  She sustained a displaced trimalleolar ankle fracture and presents now for operative treatment of this unstable right ankle injury.  The risks and benefits of the alternative treatment options have been discussed in detail.  The patient wishes to proceed with surgery and specifically understands risks of bleeding, infection, nerve damage, blood clots, need for additional surgery, amputation and death.   PROCEDURE IN DETAIL:  After pre operative consent was obtained, and the correct operative site was identified, the patient was brought to the operating room and placed supine on the OR table.  Anesthesia was administered.  Pre-operative antibiotics were administered.  A surgical timeout was taken.  The right lower extremity was prepped and draped in standard sterile fashion with a tourniquet around the thigh.  The extremity was elevated and the tourniquet was inflated to 250 mmHg.  A  longitudinal incision was made over the lateral malleolus.  Dissection was carried sharply down through the subcutaneous tissues.  Fracture site was identified.  Was cleaned of all hematoma.  The fracture was opened and used to examine the inside of the ankle.  There is no evidence of osteochondral lesion.  Small fragments of bone and cartilage were removed.  The lateral malleolus fracture was then reduced and held with a tenaculum.  A 3.5 mm fully threaded lag screw from the [employer] small frag set was inserted from anterior to posterior across the fracture site.  It was noted to have excellent purchase and compressed the fracture site appropriately.  A 7 hole one third tubular plate was then contoured to fit the lateral malleolus.  It was secured distally with 3 unicortical screws and proximally with 3 bicortical screws.  AP, mortise and lateral radiographs confirmed appropriate position and length of the hardware and appropriate reduction of the lateral malleolus fracture.  Attention was turned to the medial ankle where an incision was made at the posterior aspect of the medial malleolus.  Dissection was carried sharply down through the subcutaneous tissues.  Care was taken to protect branches of the saphenous vein.  The posterior medial fracture site was identified.  The periosteum was incised.  The posterior malleolus fractures were cleaned of all hematoma and reduced.  The lateral fragment was provisionally pinned.  The medial fragment was compressed with a tenaculum.  A K wire was inserted at the apex of the posterior medial fragment.  A partially-threaded 4 mm cannulated screw with a washer was placed over the apex of  the fracture as a buttress.  A second K wire was then placed across the posteromedial fragment.  A partially-threaded 4 mm cannulated screw was inserted and compressed the fracture site appropriately.  The posterior lateral fragment was identified and was noted to be appropriately  reduced.  The anterior K wire was overdrilled and a partially-threaded screw inserted.  Final AP, lateral and mortise radiographs showed appropriate reduction of the fractures in appropriate position and length of all hardware.  Stress examination was then performed.  Dorsiflexion and external rotation stress was applied to the supinated forefoot.  A mortise view was visualized.  The syndesmosis was noted to be stable.  However there was significant talar tilt indicating injury to the deltoid ligament.  The deltoid ligament was then carefully inspected.  The superficial deltoid was incised.  The deep deltoid ligament was noted to be ruptured centrally.  It was debrided with a rondure.  A #1 juggernaut rigid anchor was inserted into the medial malleolus after predrilling.  It was noted to have appropriate purchase.  The sutures were then passed through the deep deltoid.  These were securely advanced onto the medial malleolus, and the sutures were tied securely.  Sutures were then passed to the superficial deltoid and tied securely.  This eliminated the talar tilt.  The wound was irrigated copiously.  Vancomycin powder were sprinkled in the medial wound.  Subcutaneous tissues were approximated with Monocryl.  Skin incision was closed with nylon.  The lateral wound was irrigated and closed in the same fashion.  Sterile dressings were applied followed by a well-padded short leg splint.  The tourniquet was released after application of the dressings.  The patient was awakened from anesthesia and transported to the recovery room in stable condition.   FOLLOW UP PLAN: Nonweightbearing on the right lower extremity.  Follow-up in the office in 2 weeks for suture removal and conversion to a short leg cast.  Plan 6 weeks postop nonweightbearing immobilization.  Aspirin for DVT prophylaxis.   RADIOGRAPHS: AP, mortise and lateral radiographs of the left ankle are obtained intraoperatively.  These show interval  reduction and fixation of the trimalleolar ankle fracture.  Hardware is appropriately positioned and of the appropriate lengths.  No other acute injuries are noted.

## 2021-03-28 NOTE — Anesthesia Preprocedure Evaluation (Addendum)
Anesthesia Evaluation  Patient identified by MRN, date of birth, ID band  Reviewed: Allergy & Precautions, NPO status , Patient's Chart, lab work & pertinent test results  Airway Mallampati: II  TM Distance: >3 FB Neck ROM: Full    Dental  (+) Teeth Intact, Dental Advisory Given   Pulmonary former smoker,    breath sounds clear to auscultation       Cardiovascular  Rhythm:Regular Rate:Normal     Neuro/Psych    GI/Hepatic   Endo/Other    Renal/GU      Musculoskeletal   Abdominal   Peds  Hematology   Anesthesia Other Findings   Reproductive/Obstetrics                             Anesthesia Physical Anesthesia Plan  ASA: 2  Anesthesia Plan: General   Post-op Pain Management:  Regional for Post-op pain   Induction: Intravenous  PONV Risk Score and Plan: Ondansetron and Dexamethasone  Airway Management Planned: LMA  Additional Equipment:   Intra-op Plan:   Post-operative Plan:   Informed Consent: I have reviewed the patients History and Physical, chart, labs and discussed the procedure including the risks, benefits and alternatives for the proposed anesthesia with the patient or authorized representative who has indicated his/her understanding and acceptance.       Plan Discussed with: Anesthesiologist and CRNA  Anesthesia Plan Comments:         Anesthesia Quick Evaluation

## 2021-03-28 NOTE — Transfer of Care (Signed)
Immediate Anesthesia Transfer of Care Note  Patient: April Schultz  Procedure(s) Performed: Open Reduction Internal Fixation (ORIF) Right Trimalleolar Fracture (Right: Ankle)  Patient Location: PACU  Anesthesia Type:General  Level of Consciousness: drowsy and patient cooperative  Airway & Oxygen Therapy: Patient Spontanous Breathing and Patient connected to face mask oxygen  Post-op Assessment: Report given to RN and Post -op Vital signs reviewed and stable  Post vital signs: Reviewed and stable  Last Vitals:  Vitals Value Taken Time  BP 109/71 03/28/21 1432  Temp    Pulse 102 03/28/21 1435  Resp 16 03/28/21 1436  SpO2 92 % 03/28/21 1435  Vitals shown include unvalidated device data.  Last Pain:  Vitals:   03/28/21 1207  TempSrc: Oral  PainSc: 8       Patients Stated Pain Goal: 5 (03/28/21 1207)  Complications: No notable events documented.

## 2021-03-28 NOTE — Discharge Instructions (Addendum)
April Hewitt, MD [facility]  Please read the following information regarding your care after surgery.  Medications  You only need a prescription for the narcotic pain medicine (ex. oxycodone, Percocet, Norco).  All of the other medicines listed below are available over the counter. X Aleve 2 pills twice a day for the first 3 days after surgery. X acetominophen (Tylenol) 650 mg every 4-6 hours as you need for minor to moderate pain X oxycodone as prescribed for severe pain  Narcotic pain medicine (ex. oxycodone, Percocet, Vicodin) will cause constipation.  To prevent this problem, take the following medicines while you are taking any pain medicine. X docusate sodium (Colace) 100 mg twice a day X senna (Senokot) 2 tablets twice a day  X To help prevent blood clots, take a baby aspirin (81 mg) twice a day for two weeks after surgery.  You should also get up every hour while you are awake to move around.    Weight Bearing X Do not bear any weight on the operated leg or foot.  Cast / Splint / Dressing X Keep your splint, cast or dressing clean and dry.  Don't put anything (coat hanger, pencil, etc) down inside of it.  If it gets damp, use a hair dryer on the cool setting to dry it.  If it gets soaked, call the office to schedule an appointment for a cast change.  After your dressing, cast or splint is removed; you may shower, but do not soak or scrub the wound.  Allow the water to run over it, and then gently pat it dry.  Swelling It is normal for you to have swelling where you had surgery.  To reduce swelling and pain, keep your toes above your nose for at least 3 days after surgery.  It may be necessary to keep your foot or leg elevated for several weeks.  If it hurts, it should be elevated.  Follow Up Call my office at [phone] when you are discharged from the hospital or surgery center to schedule an appointment to be seen two weeks after surgery.  Call my office at [phone] if  you develop a fever >101.5 F, nausea, vomiting, bleeding from the surgical site or severe pain.     Post Anesthesia Home Care Instructions  Activity: Get plenty of rest for the remainder of the day. A responsible individual must stay with you for 24 hours following the procedure.  For the next 24 hours, DO NOT: -Drive a car -Operate machinery -Drink alcoholic beverages -Take any medication unless instructed by your physician -Make any legal decisions or sign important papers.  Meals: Start with liquid foods such as gelatin or soup. Progress to regular foods as tolerated. Avoid greasy, spicy, heavy foods. If nausea and/or vomiting occur, drink only clear liquids until the nausea and/or vomiting subsides. Call your physician if vomiting continues.  Special Instructions/Symptoms: Your throat may feel dry or sore from the anesthesia or the breathing tube placed in your throat during surgery. If this causes discomfort, gargle with warm salt water. The discomfort should disappear within 24 hours.  If you had a scopolamine patch placed behind your ear for the management of post- operative nausea and/or vomiting:  1. The medication in the patch is effective for 72 hours, after which it should be removed.  Wrap patch in a tissue and discard in the trash. Wash hands thoroughly with soap and water. 2. You may remove the patch earlier than 72 hours if you experience unpleasant   side effects which may include dry mouth, dizziness or visual disturbances. 3. Avoid touching the patch. Wash your hands with soap and water after contact with the patch.     Regional Anesthesia Blocks  1. Numbness or the inability to move the "blocked" extremity may last from 3-48 hours after placement. The length of time depends on the medication injected and your individual response to the medication. If the numbness is not going away after 48 hours, call your surgeon.  2. The extremity that is blocked will need to be  protected until the numbness is gone and the  Strength has returned. Because you cannot feel it, you will need to take extra care to avoid injury. Because it may be weak, you may have difficulty moving it or using it. You may not know what position it is in without looking at it while the block is in effect.  3. For blocks in the legs and feet, returning to weight bearing and walking needs to be done carefully. You will need to wait until the numbness is entirely gone and the strength has returned. You should be able to move your leg and foot normally before you try and bear weight or walk. You will need someone to be with you when you first try to ensure you do not fall and possibly risk injury.  4. Bruising and tenderness at the needle site are common side effects and will resolve in a few days.  5. Persistent numbness or new problems with movement should be communicated to the surgeon or the [facility] ([phone])/ [facility] ([phone]).    Information for Discharge Teaching: EXPAREL (bupivacaine liposome injectable suspension)   Your surgeon or anesthesiologist gave you EXPAREL(bupivacaine) to help control your pain after surgery.  EXPAREL is a local anesthetic that provides pain relief by numbing the tissue around the surgical site. EXPAREL is designed to release pain medication over time and can control pain for up to 72 hours. Depending on how you respond to EXPAREL, you may require less pain medication during your recovery.  Possible side effects: Temporary loss of sensation or ability to move in the area where bupivacaine was injected. Nausea, vomiting, constipation Rarely, numbness and tingling in your mouth or lips, lightheadedness, or anxiety may occur. Call your doctor right away if you think you may be experiencing any of these sensations, or if you have other questions regarding possible side effects.  Follow all other discharge instructions  given to you by your surgeon or nurse. Eat a healthy diet and drink plenty of water or other fluids.  If you return to the hospital for any reason within 96 hours following the administration of EXPAREL, it is important for health care providers to know that you have received this anesthetic. A teal colored band has been placed on your arm with the date, time and amount of EXPAREL you have received in order to alert and inform your health care providers. Please leave this armband in place for the full 96 hours following administration, and then you may remove the band.    

## 2021-03-28 NOTE — Anesthesia Postprocedure Evaluation (Signed)
Anesthesia Post Note  Patient: April Schultz  Procedure(s) Performed: Open Reduction Internal Fixation (ORIF) Right Trimalleolar Fracture (Right: Ankle)     Patient location during evaluation: Phase II Anesthesia Type: General Level of consciousness: awake Pain management: pain level controlled Vital Signs Assessment: post-procedure vital signs reviewed and stable Respiratory status: spontaneous breathing Cardiovascular status: stable Postop Assessment: no apparent nausea or vomiting Anesthetic complications: no   No notable events documented.  Last Vitals:  Vitals:   03/28/21 1445 03/28/21 1500  BP: 124/86 124/82  Pulse: (!) 101 83  Resp: 11 (!) 9  Temp:    SpO2: 96% 96%    Last Pain:  Vitals:   03/28/21 1500  TempSrc:   PainSc: 0-No pain                 John F Scharlene Corn

## 2021-03-28 NOTE — H&P (Signed)
April Schultz is an 40 y.o. female.   Chief Complaint: Right ankle pain HPI: The patient is a 40 year old woman without significant past medical history.  She fell on some stone steps a few days ago injuring her right ankle.  Radiographs and a CT scan reveal a displaced and unstable trimalleolar ankle fracture.  She presents now for open treatment with internal fixation.  Past Medical History:  Diagnosis Date   Anxiety    Depression    GERD (gastroesophageal reflux disease)    Hyperlipidemia    Hypothyroidism    Irritable bowel syndrome (IBS)     Past Surgical History:  Procedure Laterality Date   MOUTH SURGERY      Family History  Problem Relation Age of Onset   Breast cancer Mother 9   Social History:  reports that she quit smoking about 22 years ago. Her smoking use included cigarettes. She smoked an average of 1.00 packs per day. She has never used smokeless tobacco. She reports current alcohol use. She reports that she does not use drugs.  Allergies:  Allergies  Allergen Reactions   Diclofenac     Other reaction(s): anaphylaxis   Ginger Diarrhea   Nsaids     Other reaction(s): anaphylaxis   Sesame Oil Nausea And Vomiting   Shellfish Allergy Nausea And Vomiting    Medications Prior to Admission  Medication Sig Dispense Refill   ALPRAZolam (XANAX) 0.25 MG tablet Take 0.25-0.5 mg by mouth 3 (three) times daily as needed. For anxiety.  0   buPROPion (WELLBUTRIN XL) 150 MG 24 hr tablet Take 150 mg by mouth every morning.  6   cetirizine (ZYRTEC) 10 MG tablet Take 10 mg by mouth daily.     clonazePAM (KLONOPIN) 0.5 MG tablet Take 0.5-1 mg by mouth at bedtime as needed. For insomnia.  0   ibuprofen (ADVIL) 600 MG tablet Take 1 tablet (600 mg total) by mouth every 6 (six) hours as needed. 30 tablet 0   oxyCODONE-acetaminophen (PERCOCET/ROXICET) 5-325 MG tablet Take 1 tablet by mouth every 6 (six) hours as needed for severe pain. 15 tablet 0   sertraline (ZOLOFT) 100  MG tablet Take 50 mg by mouth daily.  3   EPINEPHrine 0.3 mg/0.3 mL IJ SOAJ injection Inject 0.3 mLs (0.3 mg total) into the muscle once. 1 Device 2    No results found for this or any previous visit (from the past 48 hour(s)). CT ANKLE RIGHT WO CONTRAST  Result Date: 03/27/2021 CLINICAL DATA:  Trimalleolar right ankle fracture. Preoperative planning EXAM: CT OF THE RIGHT ANKLE WITHOUT CONTRAST TECHNIQUE: Multidetector CT imaging of the right ankle was performed according to the standard protocol. Multiplanar CT image reconstructions were also generated. COMPARISON:  X-ray 03/24/2021 FINDINGS: Bones/Joint/Cartilage Right ankle fracture-dislocation status post reduction. Obliquely oriented distal fibular metaphyseal fracture with 7 mm of posterior displacement. Fracture extends intra-articularly to the distal tibiofibular joint. There are a few adjacent comminuted fracture fragments. Comminuted posterior malleolar fracture with mild posterior and superior displacement resulting in up to 7 mm of articular-surface diastasis. Fracture line extends to involve the posterior margin of the medial malleolus. There is a minimally displaced avulsion fracture emanating from the inferior tip of the medial malleolus. Ankle mortise is congruent without dislocation. Talus intact without evidence of fracture. Subtalar joint alignment is maintained. Osseous structures of the midfoot are intact. TMT joints remain aligned. Ligaments Suboptimally assessed by CT. Muscles and Tendons Musculotendinous structures appear grossly intact within the limitations of CT.  Soft tissues Diffuse soft tissue swelling with moderate sized ill-defined hematoma overlying the fibular fracture site. IMPRESSION: 1. Trimalleolar right ankle fracture-dislocation status post reduction. 2. Diffuse soft tissue swelling with moderate sized ill-defined hematoma overlying the fibular fracture site. Electronically Signed   By: Duanne Guess D.O.   On:  03/27/2021 14:06    Review of Systems no recent fever, chills, nausea, vomiting or changes in her appetite  Blood pressure (!) 143/98, pulse 75, temperature 98.3 F (36.8 C), temperature source Oral, resp. rate 16, height 5\' 2"  (1.575 m), weight 97.9 kg, last menstrual period 03/25/2021, SpO2 98 %. Physical Exam  Well-nourished well-developed woman in no apparent distress.  Alert and oriented x4.  Normal mood and affect.  Gait is nonweightbearing on the right.  The right ankle has healthy skin and moderate swelling.  Intact sensibility to light touch dorsally and plantarly at the forefoot.  Active plantar flexion and dorsiflexion strength at the toes.   Assessment/Plan Right ankle trimalleolar fracture -to the operating room today for open treatment with internal fixation.  The risks and benefits of the alternative treatment options have been discussed in detail.  The patient wishes to proceed with surgery and specifically understands risks of bleeding, infection, nerve damage, blood clots, need for additional surgery, amputation and death.   03/27/2021, MD 04-23-2021, 12:27 PM

## 2021-03-28 NOTE — Anesthesia Procedure Notes (Signed)
Anesthesia Regional Block: Adductor canal block   Pre-Anesthetic Checklist: , timeout performed,  Correct Patient, Correct Site, Correct Laterality,  Correct Procedure, Correct Position, site marked,  Risks and benefits discussed,  Pre-op evaluation,  At surgeon's request and post-op pain management  Laterality: Right  Prep: Maximum Sterile Barrier Precautions used, chloraprep       Needles:  Injection technique: Single-shot  Needle Type: Echogenic Stimulator Needle     Needle Length: 9cm  Needle Gauge: 21     Additional Needles:   Procedures:,,,, ultrasound used (permanent image in chart),,    Narrative:  Start time: 03/28/2021 12:40 PM End time: 03/28/2021 12:45 PM Injection made incrementally with aspirations every 5 mL.  Performed by: Personally  Anesthesiologist: Kipp Brood, MD  Additional Notes: 20 cc 0.75% Ropivacaine

## 2021-03-28 NOTE — Progress Notes (Signed)
Assisted Dr. Joslin with right, ultrasound guided, popliteal/saphenous block. Side rails up, monitors on throughout procedure. See vital signs in flow sheet. Tolerated Procedure well. 

## 2021-03-28 NOTE — Anesthesia Procedure Notes (Signed)
Anesthesia Regional Block: Popliteal block   Pre-Anesthetic Checklist: , timeout performed,  Correct Patient, Correct Site, Correct Laterality,  Correct Procedure, Correct Position, site marked,  Risks and benefits discussed,  Surgical consent,  Pre-op evaluation,  At surgeon's request and post-op pain management  Laterality: Right  Prep: chloraprep       Needles:  Injection technique: Single-shot  Needle Type: Stimulator Needle - 40      Needle Gauge: 22     Additional Needles:   Procedures:, nerve stimulator,,,,,    Narrative:  Start time: 03/28/2021 12:35 PM End time: 03/28/2021 12:40 PM Injection made incrementally with aspirations every 5 mL.  Performed by: Personally   Additional Notes: 20 cc 0.5% Bupivacaine 1:200 epi 10 cc 1.3% Exparel

## 2021-03-28 NOTE — Anesthesia Procedure Notes (Signed)
Procedure Name: LMA Insertion Date/Time: 03/28/2021 1:01 PM Performed by: Thornell Mule, CRNA Pre-anesthesia Checklist: Patient identified, Emergency Drugs available, Suction available and Patient being monitored Patient Re-evaluated:Patient Re-evaluated prior to induction Oxygen Delivery Method: Circle system utilized Preoxygenation: Pre-oxygenation with 100% oxygen Induction Type: IV induction LMA: LMA inserted LMA Size: 4.0 Number of attempts: 1 Placement Confirmation: positive ETCO2 Tube secured with: Tape Dental Injury: Teeth and Oropharynx as per pre-operative assessment

## 2021-03-31 ENCOUNTER — Encounter (HOSPITAL_BASED_OUTPATIENT_CLINIC_OR_DEPARTMENT_OTHER): Payer: Self-pay | Admitting: Orthopedic Surgery

## 2021-07-02 ENCOUNTER — Other Ambulatory Visit: Payer: Self-pay | Admitting: Family Medicine

## 2021-07-02 DIAGNOSIS — Z1231 Encounter for screening mammogram for malignant neoplasm of breast: Secondary | ICD-10-CM

## 2022-03-21 IMAGING — CT CT ANKLE*R* W/O CM
1 series · 11 of 14 positions shown, 14 images · non-contrast
Comparison: X-ray 03/24/2021

CLINICAL DATA: Trimalleolar right ankle fracture. Preoperative
planning

EXAM:
CT OF THE RIGHT ANKLE WITHOUT CONTRAST
TECHNIQUE: Multidetector CT imaging of the right ankle was performed according
to the standard protocol. Multiplanar CT image reconstructions were
also generated.

[Series 4: knee soft tissue · axial · 0.34mm/px · z∈[-166,-1]mm · 11 of 73 slices shown, 14 images]
[im 12/73  soft-tissue]
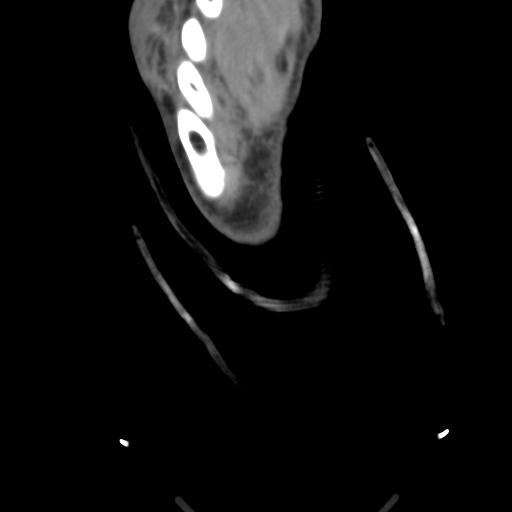
[im 12/73  bone]
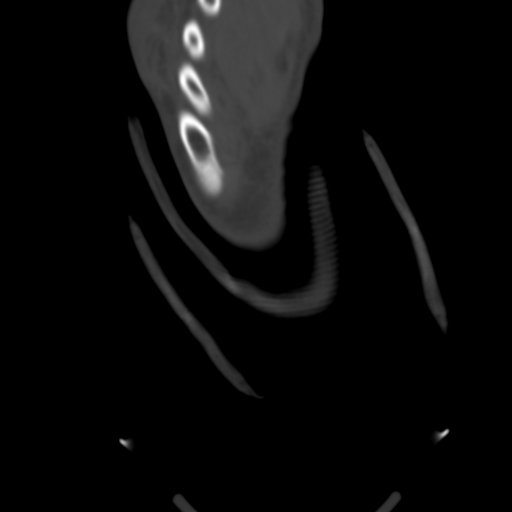
[im 17/73  bone]
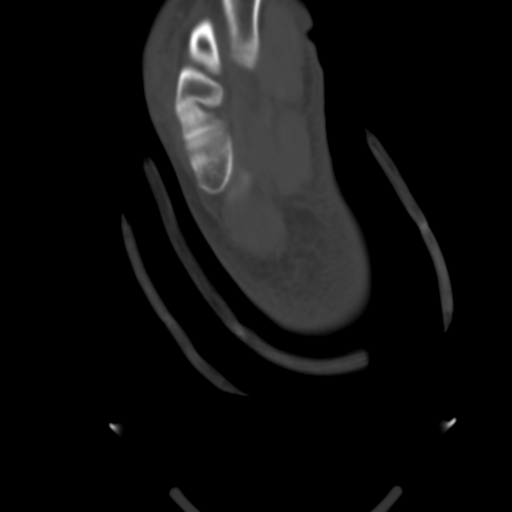
[im 23/73  bone]
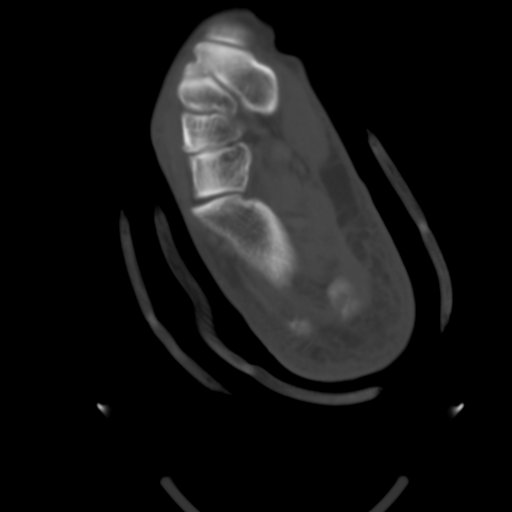
[im 28/73  bone]
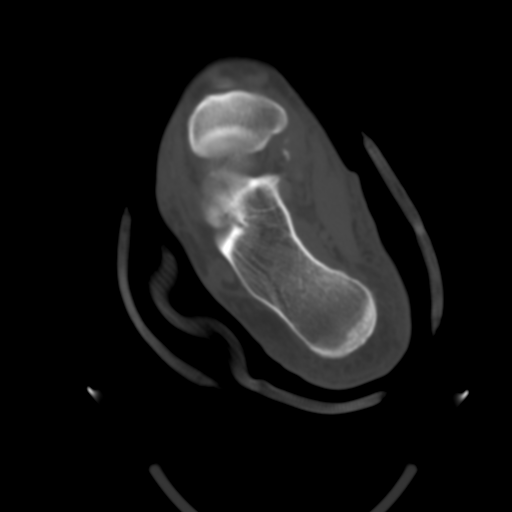
[im 34/73  soft-tissue]
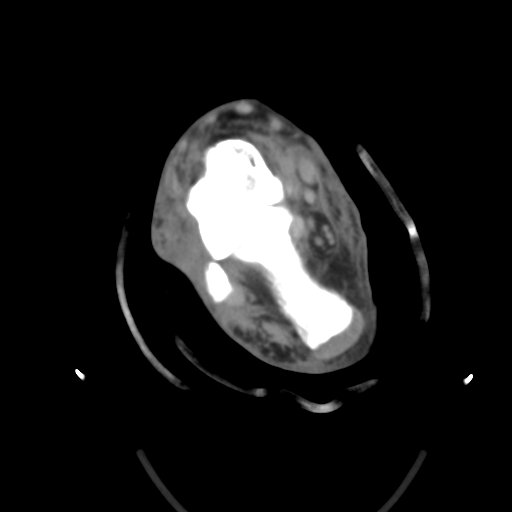
[im 34/73  bone]
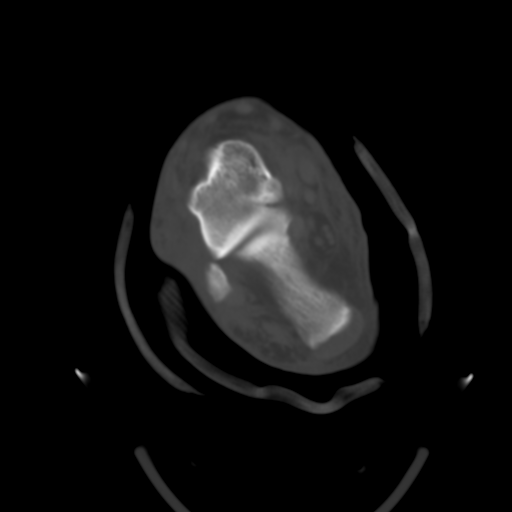
[im 39/73  bone]
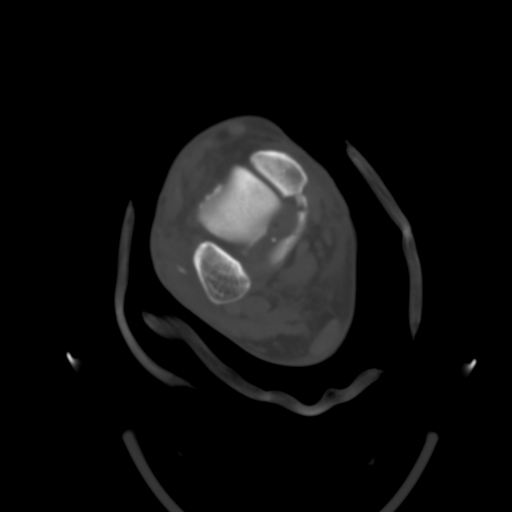
[im 45/73  bone]
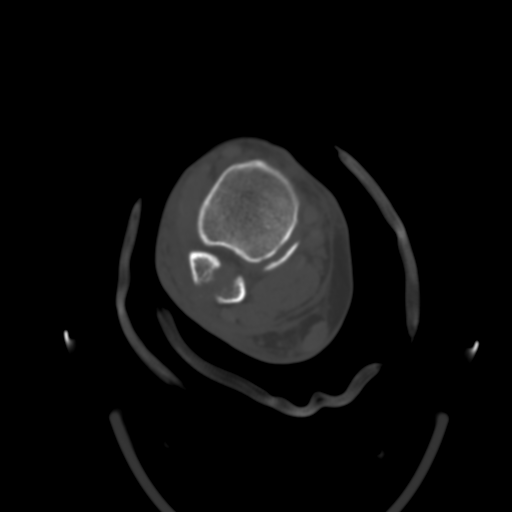
[im 50/73  bone]
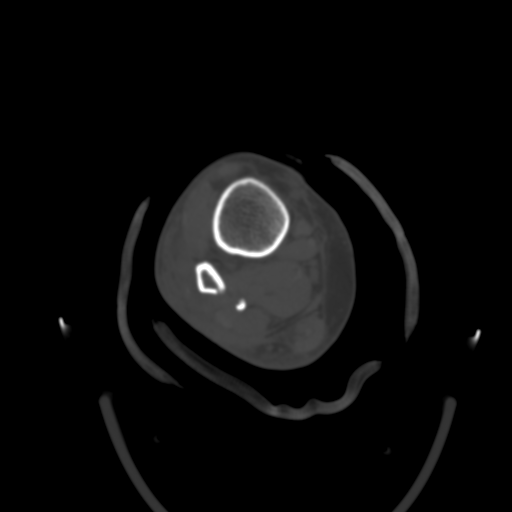
[im 56/73  soft-tissue]
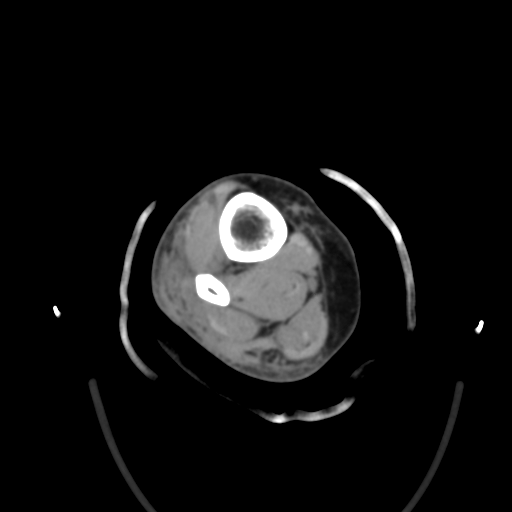
[im 56/73  bone]
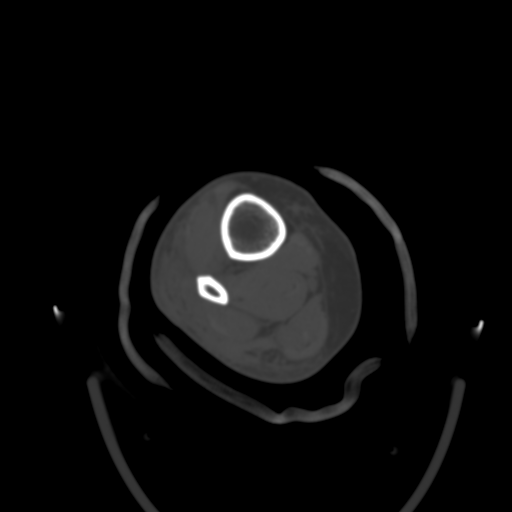
[im 61/73  bone]
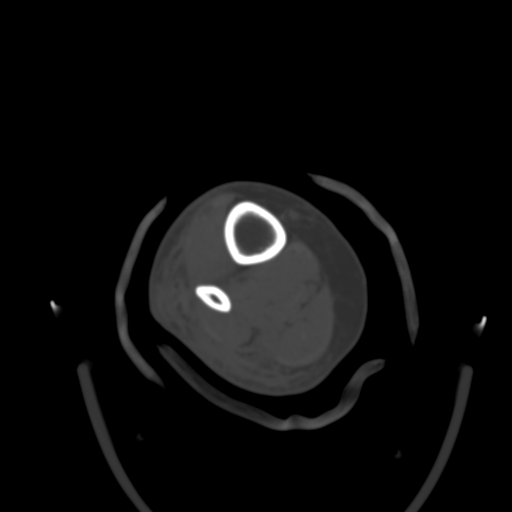
[im 67/73  bone]
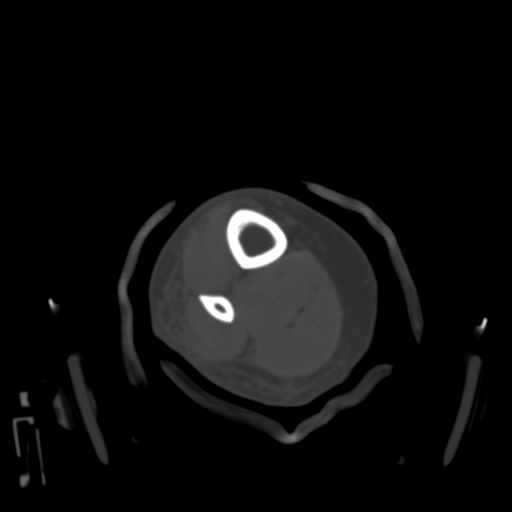

[11 of 14 positions shown; findings below may reference images not displayed]

FINDINGS: Bones/Joint/Cartilage

Right ankle fracture-dislocation status post reduction. Obliquely
oriented distal fibular metaphyseal fracture with 7 mm of posterior
displacement. Fracture extends intra-articularly to the distal
tibiofibular joint. There are a few adjacent comminuted fracture
fragments.

Comminuted posterior malleolar fracture with mild posterior and
superior displacement resulting in up to 7 mm of articular-surface
diastasis. Fracture line extends to involve the posterior margin of
the medial malleolus. There is a minimally displaced avulsion
fracture emanating from the inferior tip of the medial malleolus.

Ankle mortise is congruent without dislocation. Talus intact without
evidence of fracture. Subtalar joint alignment is maintained.
Osseous structures of the midfoot are intact. TMT joints remain
aligned.

Ligaments

Suboptimally assessed by CT.

Muscles and Tendons

Musculotendinous structures appear grossly intact within the
limitations of CT.

Soft tissues

Diffuse soft tissue swelling with moderate sized ill-defined
hematoma overlying the fibular fracture site.
IMPRESSION: 1. Trimalleolar right ankle fracture-dislocation status post
reduction.
2. Diffuse soft tissue swelling with moderate sized ill-defined
hematoma overlying the fibular fracture site.

## 2023-07-02 ENCOUNTER — Other Ambulatory Visit: Payer: Self-pay | Admitting: Family Medicine

## 2023-07-02 DIAGNOSIS — Z1231 Encounter for screening mammogram for malignant neoplasm of breast: Secondary | ICD-10-CM

## 2024-03-14 ENCOUNTER — Other Ambulatory Visit: Payer: Self-pay | Admitting: Family Medicine

## 2024-03-14 DIAGNOSIS — R1011 Right upper quadrant pain: Secondary | ICD-10-CM

## 2024-03-16 ENCOUNTER — Ambulatory Visit
Admission: RE | Admit: 2024-03-16 | Discharge: 2024-03-16 | Disposition: A | Discharge status: Home / Self Care | Payer: Self-pay | Source: Ambulatory Visit | Attending: Family Medicine | Admitting: Family Medicine

## 2024-03-16 DIAGNOSIS — R1011 Right upper quadrant pain: Secondary | ICD-10-CM

## 2024-07-07 ENCOUNTER — Other Ambulatory Visit: Payer: Self-pay | Admitting: Family Medicine

## 2024-07-07 DIAGNOSIS — Z1231 Encounter for screening mammogram for malignant neoplasm of breast: Secondary | ICD-10-CM
